# Patient Record
Sex: Male | Born: 1985
Health system: Southern US, Community
[De-identification: ages and names within clinical notes are randomized; demographics above are authoritative.]

## PROBLEM LIST (undated history)

## (undated) DIAGNOSIS — Q210 Ventricular septal defect: Secondary | ICD-10-CM

## (undated) HISTORY — DX: Ventricular septal defect: Q21.0

## (undated) HISTORY — PX: OTHER SURGICAL HISTORY: SHX169

---
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
## 2006-07-14 ENCOUNTER — Ambulatory Visit: Payer: Self-pay | Admitting: Internal Medicine

## 2006-07-14 LAB — CONVERTED CEMR LAB
ALT: 27 units/L (ref 0–40)
AST: 19 units/L (ref 0–37)
Albumin: 4.4 g/dL (ref 3.5–5.2)
Alkaline Phosphatase: 58 units/L (ref 39–117)
BUN: 11 mg/dL (ref 6–23)
Basophils Absolute: 0 10*3/uL (ref 0.0–0.1)
Basophils Relative: 0.4 % (ref 0.0–1.0)
Bilirubin, Direct: 0.1 mg/dL (ref 0.0–0.3)
CO2: 31 meq/L (ref 19–32)
Calcium: 9.3 mg/dL (ref 8.4–10.5)
Chloride: 108 meq/L (ref 96–112)
Cholesterol: 163 mg/dL (ref 0–200)
Creatinine, Ser: 0.9 mg/dL (ref 0.4–1.5)
Eosinophils Absolute: 0.4 10*3/uL (ref 0.0–0.6)
Eosinophils Relative: 6.3 % — ABNORMAL HIGH (ref 0.0–5.0)
GFR calc Af Amer: 138 mL/min
GFR calc non Af Amer: 114 mL/min
Glucose, Bld: 94 mg/dL (ref 70–99)
HCT: 41.6 % (ref 39.0–52.0)
HDL: 32 mg/dL — ABNORMAL LOW (ref >39.0)
Hemoglobin: 14.8 g/dL (ref 13.0–17.0)
LDL Cholesterol: 112 mg/dL — ABNORMAL HIGH (ref 0–99)
Lymphocytes Relative: 23.4 % (ref 12.0–46.0)
MCHC: 35.5 g/dL (ref 30.0–36.0)
MCV: 89.8 fL (ref 78.0–100.0)
Monocytes Absolute: 0.5 10*3/uL (ref 0.2–0.7)
Monocytes Relative: 8.5 % (ref 3.0–11.0)
Neutro Abs: 3.9 10*3/uL (ref 1.4–7.7)
Neutrophils Relative %: 61.4 % (ref 43.0–77.0)
Platelets: 221 10*3/uL (ref 150–400)
Potassium: 4.1 meq/L (ref 3.5–5.1)
RBC: 4.63 M/uL (ref 4.22–5.81)
RDW: 11.3 % — ABNORMAL LOW (ref 11.5–14.6)
Sodium: 143 meq/L (ref 135–145)
TSH: 5.23 microintl units/mL (ref 0.35–5.50)
Total Bilirubin: 0.9 mg/dL (ref 0.3–1.2)
Total CHOL/HDL Ratio: 5.1
Total Protein: 7.1 g/dL (ref 6.0–8.3)
Triglycerides: 94 mg/dL (ref 0–149)
VLDL: 19 mg/dL (ref 0–40)
WBC: 6.4 10*3/uL (ref 4.5–10.5)

## 2006-10-08 DIAGNOSIS — J309 Allergic rhinitis, unspecified: Secondary | ICD-10-CM | POA: Insufficient documentation

## 2006-10-08 DIAGNOSIS — J45909 Unspecified asthma, uncomplicated: Secondary | ICD-10-CM | POA: Insufficient documentation

## 2008-02-03 ENCOUNTER — Ambulatory Visit: Payer: Self-pay | Admitting: Family Medicine

## 2008-02-03 DIAGNOSIS — R42 Dizziness and giddiness: Secondary | ICD-10-CM | POA: Insufficient documentation

## 2008-09-06 ENCOUNTER — Encounter: Admission: RE | Admit: 2008-09-06 | Discharge: 2008-11-08 | Payer: Self-pay | Admitting: Unknown Physician Specialty

## 2009-03-07 ENCOUNTER — Telehealth: Payer: Self-pay | Admitting: Family Medicine

## 2010-03-29 NOTE — Progress Notes (Signed)
Summary: ? pink eye  Phone Note Call from Patient   Caller: Patient Summary of Call: thinks has conjunctivitis eye sticky red matter,  [facility].  thanks  Initial call taken by: Pura Spice, RN,  March 07, 2009 10:11 AM  Follow-up for Phone Call        I sent in a rx for Tobradex. Follow-up by: Nelwyn Salisbury MD,  March 07, 2009 10:28 AM    New/Updated Medications: TOBRADEX 0.3-0.1 % SUSP (TOBRAMYCIN-DEXAMETHASONE) apply 2 drops to eyes q 4 hours as needed Prescriptions: TOBRADEX 0.3-0.1 % SUSP (TOBRAMYCIN-DEXAMETHASONE) apply 2 drops to eyes q 4 hours as needed  #10 x 0   Entered and Authorized by:   Nelwyn Salisbury MD   Signed by:   Nelwyn Salisbury MD on 03/07/2009   Method used:   Electronically to        [facility] [phone]* (retail)       [address]       [place], [place]  [address]       Ph: [phone] or [phone]       Fax: [phone]   RxID:   [id number]

## 2010-07-11 LAB — HEPATIC FUNCTION PANEL
ALT: 14 U/L (ref 10–40)
AST: 14 U/L (ref 14–40)
Alkaline Phosphatase: 72 U/L (ref 25–125)
Bilirubin, Direct: 0.1 mg/dL
Bilirubin, Total: 0.6 mg/dL

## 2010-07-11 LAB — BASIC METABOLIC PANEL: BUN: 11 mg/dL (ref 4–21)

## 2010-07-30 ENCOUNTER — Ambulatory Visit: Payer: Self-pay | Admitting: Internal Medicine

## 2010-08-07 ENCOUNTER — Ambulatory Visit (INDEPENDENT_AMBULATORY_CARE_PROVIDER_SITE_OTHER): Payer: 59 | Admitting: Internal Medicine

## 2010-08-07 ENCOUNTER — Encounter: Payer: Self-pay | Admitting: Internal Medicine

## 2010-08-07 ENCOUNTER — Other Ambulatory Visit: Payer: Self-pay | Admitting: Internal Medicine

## 2010-08-07 VITALS — BP 116/74 | HR 88 | Ht 71.0 in | Wt 219.0 lb

## 2010-08-07 DIAGNOSIS — R1013 Epigastric pain: Secondary | ICD-10-CM

## 2010-08-07 DIAGNOSIS — R141 Gas pain: Secondary | ICD-10-CM

## 2010-08-07 DIAGNOSIS — R143 Flatulence: Secondary | ICD-10-CM

## 2010-08-07 DIAGNOSIS — K3189 Other diseases of stomach and duodenum: Secondary | ICD-10-CM

## 2010-08-07 DIAGNOSIS — R140 Abdominal distension (gaseous): Secondary | ICD-10-CM

## 2010-08-07 MED ORDER — ESOMEPRAZOLE MAGNESIUM 40 MG PO CPDR: 40.0000 mg | DELAYED_RELEASE_CAPSULE | Freq: Every day | ORAL | Status: DC

## 2010-08-07 NOTE — Patient Instructions (Addendum)
We have sent a prescription to your pharmacy for Nexium. You should take 1 tablet by mouth once daily. Dr Massie Kluver

## 2010-08-07 NOTE — Progress Notes (Signed)
Billy Mcguire 09/10/1985 MRN [id number]        History of Present Illness:  This is a 25 year old white male patient of Dr. Christell Constant has been experiencing episodes of bloating and belching which started while on a cruise through the [place] in February 2012. He had several episodes of vomiting and belching but no diarrhea or fever. After his return  Home, he has been experiencing episodes of dyspepsia, occasional heartburn and bloating. The bad belches have subsided after a course of Flagyl 250 mg 3 times a day for 10 days. He has been able to resume his usual eating habits. He takes occasional ibuprofen. He does not smoke and does not drink excessive caffeine. He has tried Gas-X without much relief. There is no family history of inflammatory bowel disease or gallbladder problems. His weight has been stable. His stools cultures and C. difficile toxin were negative. His CBC has been normal. He in general is feeling fine except for a nonspecific epigastric tenderness.   Past Medical History  Diagnosis Date  . VSD (ventricular septal defect)   . Asthma    Past Surgical History  Procedure Date  . Surgery for vsd     as an infant    reports that he has never smoked. He has never used smokeless tobacco. He reports that he does not drink alcohol or use illicit drugs. family history includes Colon polyps in his paternal grandmother; Diabetes in his paternal grandmother; Hypertension in his maternal grandfather; and Prostate cancer in his paternal uncle. No Known Allergies      Review of Systems: Denies dysphagia, odynophagia. Denies shortness of breath or chest pain. Negative for diarrhea  The remainder of the 10  point ROS is negative except as outlined in H&P   Physical Exam: General appearance  Well developed in no distress Eyes- non icteric HEENT nontraumatic, normocephalic Mouth no lesions, tongue papillated, no cheilosis Neck supple without adenopathy, thyroid not enlarged, no  carotid bruits, no JVD Lungs Clear to auscultation bilaterally Cor normal S1 normal S2, regular rhythm , no murmur,  quiet precordium Abdomen soft with normal active bowel sounds. No distention. No tympany. Mild nonspecific tenderness in epigastrium. No CVA tenderness. Lower abdomen unremarkable, liver edge at costal margin Rectal: Soft Hemoccult negative stool Extremities no pedal edema Skin no lesions Neurological alert and oriented x3 Psychological normal mood and  affect  Assessment and Plan:  Nonspecific dyspepsia and epigastric discomfort consistent with gastritis. His  H. pylori titer was negative. His symptoms started likely witht infectious gastroenteritis or possibly peptic gastritis. 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 He is doing much better in that respect. after a course of Flagyl. Nothing to suggest inflammatory bowel disease or biliary problems. 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 We will check tissue transglutaminase levels to rule out gluten allergy. We will also give a four-week trial off Nexium 40 mg a day. If not improved in 4 weeks we'll proceed with diagnostic upper endoscopy and biopsies. He agrees to the plan  Hx of VSD : status  post surgical repair as an infant. There are no issues at this time   08/07/2010 Billy Mcguire

## 2010-08-08 LAB — TISSUE TRANSGLUTAMINASE, IGA: Tissue Transglutaminase Ab, IgA: 6.1 U/mL (ref <20)

## 2010-08-12 NOTE — Progress Notes (Signed)
Awaiting results

## 2010-08-14 NOTE — Progress Notes (Signed)
Patient already advised of normal results. See 08/07/10 note scanned under "media."

## 2010-09-04 ENCOUNTER — Ambulatory Visit: Payer: Self-pay | Admitting: Internal Medicine

## 2011-03-12 LAB — CBC AND DIFFERENTIAL
HCT: 43 % (ref 41–53)
Hemoglobin: 14.1 g/dL (ref 13.5–17.5)
Platelets: 217 10*3/uL (ref 150–399)
WBC: 6.8 10^3/mL

## 2012-05-09 ENCOUNTER — Encounter: Payer: Self-pay | Admitting: *Deleted

## 2012-06-25 ENCOUNTER — Telehealth: Payer: Self-pay | Admitting: *Deleted

## 2012-06-25 MED ORDER — ONDANSETRON HCL 4 MG PO TABS: 4.0000 mg | ORAL_TABLET | Freq: Three times a day (TID) | ORAL | Status: DC | PRN

## 2012-06-25 NOTE — Telephone Encounter (Signed)
Pt called in with complaints of nausea,vomitting and diarrhea

## 2012-09-21 ENCOUNTER — Ambulatory Visit (INDEPENDENT_AMBULATORY_CARE_PROVIDER_SITE_OTHER): Payer: 59 | Admitting: Family Medicine

## 2012-09-21 ENCOUNTER — Encounter: Payer: Self-pay | Admitting: Family Medicine

## 2012-09-21 VITALS — BP 120/84 | HR 79 | Temp 98.4°F | Wt 217.8 lb

## 2012-09-21 DIAGNOSIS — J019 Acute sinusitis, unspecified: Secondary | ICD-10-CM

## 2012-09-21 DIAGNOSIS — J029 Acute pharyngitis, unspecified: Secondary | ICD-10-CM

## 2012-09-21 LAB — POCT RAPID STREP A (OFFICE): Rapid Strep A Screen: NEGATIVE

## 2012-09-21 MED ORDER — CEFDINIR 300 MG PO CAPS: 300.0000 mg | ORAL_CAPSULE | Freq: Two times a day (BID) | ORAL | Status: DC

## 2012-09-21 MED ORDER — FLUTICASONE PROPIONATE 50 MCG/ACT NA SUSP: 2.0000 | Freq: Every day | NASAL | Status: DC

## 2012-09-21 NOTE — Progress Notes (Signed)
Patient ID: Billy Mcguire, male   DOB: 1985-07-01, 27 y.o.   MRN: [id number] SUBJECTIVE: CC: Chief Complaint  Patient presents with  . Acute Visit    sore throat congestion  taking otc sudafed    HPI: As above. Ears feels full. Had a fever today. Pressure in the sinuses. Head very stuffed up,  Past Medical History  Diagnosis Date  . VSD (ventricular septal defect)   . Asthma    Past Surgical History  Procedure Laterality Date  . Surgery for vsd      as an infant   No current outpatient prescriptions on file prior to visit.   No current facility-administered medications on file prior to visit.   No Known Allergies Immunization History  Administered Date(s) Administered  . Influenza Split 12/19/2011   History   Social History  . Marital Status: Single    Spouse Name: N/A    Number of Children: 0  . Years of Education: N/A   Occupational History  . 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 Smoking status: Never Smoker   . Smokeless tobacco: Never Used  . Alcohol Use: No  . Drug Use: No  . Sexually Active: Not on file   Other Topics Concern  . Not on file   Social History Narrative  . No narrative on file      ROS: As above in the HPI. All other systems are stable or negative.  OBJECTIVE: APPEARANCE:  Patient in no acute distress.The patient appeared well nourished and normally developed. Acyanotic. Waist: VITAL SIGNS:BP 120/84  Pulse 79  Temp(Src) 98.4 F (36.9 C) (Oral)  Wt 217 lb 12.8 oz (98.793 kg)  BMI 30.39 kg/m2 Afebrile at present. stuffy  SKIN: warm and  Dry without overt rashes, tattoos and scars  HEAD and Neck: without JVD, Head and scalp: normal Eyes:No scleral icterus. Fundi normal, eye movements normal. Ears: Auricle normal, canal normal, Tympanic membranes on the right red with a small effusion, insufflation normal. Nose: congested with the maxillary and frontal sinuses mildly tender. Throat: normal Neck &  thyroid: normal  CHEST & LUNGS: Chest wall: normal Lungs: Clear  CVS: Reveals the PMI to be normally located. Regular rhythm, First and Second Heart sounds are normal,  absence of murmurs, rubs or gallops. Peripheral vasculature: Radial pulses: normal Dorsal pedis pulses: normal Posterior pulses: normal   ASSESSMENT: Sore throat - Plan: POCT rapid strep A  Sinusitis, acute - Plan: cefdinir (OMNICEF) 300 MG capsule, fluticasone (FLONASE) 50 MCG/ACT nasal spray  PLAN:  Orders Placed This Encounter  Procedures  . POCT rapid strep A   Results for orders placed in visit on 09/21/12  POCT RAPID STREP A (OFFICE)      Result Value Range   Rapid Strep A Screen Negative  Negative   Meds ordered this encounter  Medications  . pseudoephedrine (SUDAFED) 30 MG tablet    Sig: Take 30 mg by mouth every 6 (six) hours as needed for congestion.  . cefdinir (OMNICEF) 300 MG capsule    Sig: Take 1 capsule (300 mg total) by mouth 2 (two) times daily.    Dispense:  20 capsule    Refill:  0  . fluticasone (FLONASE) 50 MCG/ACT nasal spray    Sig: Place 2 sprays into the nose daily.    Dispense:  16 g    Refill:  3    Return if symptoms worsen or fail to improve.  Adren Dollins P. Modesto Charon,  M.D.

## 2012-10-06 ENCOUNTER — Ambulatory Visit (INDEPENDENT_AMBULATORY_CARE_PROVIDER_SITE_OTHER): Payer: 59 | Admitting: Family Medicine

## 2012-10-06 ENCOUNTER — Encounter: Payer: Self-pay | Admitting: Family Medicine

## 2012-10-06 VITALS — BP 108/70 | HR 101 | Temp 100.5°F | Wt 223.0 lb

## 2012-10-06 DIAGNOSIS — R52 Pain, unspecified: Secondary | ICD-10-CM | POA: Insufficient documentation

## 2012-10-06 DIAGNOSIS — R509 Fever, unspecified: Secondary | ICD-10-CM | POA: Insufficient documentation

## 2012-10-06 LAB — POCT INFLUENZA A/B
Influenza A, POC: NEGATIVE
Influenza B, POC: NEGATIVE

## 2012-10-06 LAB — POCT CBC
Granulocyte percent: 87.1 %G — AB (ref 37–80)
HCT, POC: 41.2 % — AB (ref 43.5–53.7)
Hemoglobin: 14 g/dL — AB (ref 14.1–18.1)
Lymph, poc: 1.3 (ref 0.6–3.4)
MCH, POC: 29.2 pg (ref 27–31.2)
MCHC: 34 g/dL (ref 31.8–35.4)
MCV: 85.7 fL (ref 80–97)
MPV: 8.1 fL (ref 0–99.8)
POC Granulocyte: 12.4 — AB (ref 2–6.9)
POC LYMPH PERCENT: 8.9 %L — AB (ref 10–50)
Platelet Count, POC: 203 10*3/uL (ref 142–424)
RBC: 4.8 M/uL (ref 4.69–6.13)
RDW, POC: 12.4 %
WBC: 14.2 10*3/uL — AB (ref 4.6–10.2)

## 2012-10-06 MED ORDER — DOXYCYCLINE HYCLATE 100 MG PO TABS: 100.0000 mg | ORAL_TABLET | Freq: Two times a day (BID) | ORAL | Status: DC

## 2012-10-06 NOTE — Progress Notes (Signed)
Patient ID: Billy Mcguire, male   DOB: 01/11/1986, 27 y.o.   MRN: [id number] SUBJECTIVE: CC: Chief Complaint  Patient presents with  . Acute Visit    fever cough chills achy    HPI: Fever today. Lymph glands feel full. achey all over. No urinary symptoms. Minimal nasal congestion.only an occasional cough. No tick bite.  Past Medical History  Diagnosis Date  . VSD (ventricular septal defect)   . Asthma    Past Surgical History  Procedure Laterality Date  . Surgery for vsd      as an infant   Current Outpatient Prescriptions on File Prior to Visit  Medication Sig Dispense Refill  . fluticasone (FLONASE) 50 MCG/ACT nasal spray Place 2 sprays into the nose daily.  16 g  3  . cefdinir (OMNICEF) 300 MG capsule Take 1 capsule (300 mg total) by mouth 2 (two) times daily.  20 capsule  0  . pseudoephedrine (SUDAFED) 30 MG tablet Take 30 mg by mouth every 6 (six) hours as needed for congestion.       No current facility-administered medications on file prior to visit.   No Known Allergies Immunization History  Administered Date(s) Administered  . Influenza Split 12/19/2011   History   Social History  . Marital Status: Single    Spouse Name: N/A    Number of Children: 0  . Years of Education: N/A   Occupational History  . 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 Smoking status: Never Smoker   . Smokeless tobacco: Never Used  . Alcohol Use: No  . Drug Use: No  . Sexually Active: Not on file   Other Topics Concern  . Not on file   Social History Narrative  . No narrative on file      ROS: As above in the HPI. All other systems are stable or negative.  OBJECTIVE: APPEARANCE:  Patient in no acute distress.The patient appeared well nourished and normally developed. Acyanotic. Waist: VITAL SIGNS:BP 108/70  Pulse 101  Temp(Src) 100.5 F (38.1 C) (Oral)  Wt 223 lb (101.152 kg)  BMI 31.12 kg/m2  SpO2 94% WM febrile SKIN: warm and  Dry without  overt rashes, tattoos and scars. Low grade fever post ibuprofen.  HEAD and Neck: without JVD, Head and scalp: normal Eyes:No scleral icterus. Fundi normal, eye movements normal. Ears: Auricle normal, canal normal, Tympanic membranes normal, insufflation normal. Nose: stuffiness Throat: normal Neck & thyroid: normal  CHEST & LUNGS: Chest wall: normal Lungs: Clear  CVS: Reveals the PMI to be normally located. Regular rhythm, First and Second Heart sounds are normal,  absence of murmurs, rubs or gallops. Peripheral vasculature: Radial pulses: normal  ABDOMEN:  Appearance: normal Benign, no organomegaly, no masses, no Abdominal Aortic enlargement. No Guarding , no rebound. No Bruits. Bowel sounds: normal  RECTAL: N/A GU: N/A  EXTREMETIES: nonedematous. Both Femoral and Pedal pulses are normal.  MUSCULOSKELETAL:  Spine: normal Joints: intact  NEUROLOGIC: oriented to time,place and person; nonfocal.  ASSESSMENT: Fever, unspecified - Plan: doxycycline (VIBRA-TABS) 100 MG tablet  Body aches - Plan: POCT CBC, POCT Influenza A/B  PLAN: Orders Placed This Encounter  Procedures  . POCT CBC  . POCT Influenza A/B   Results for orders placed in visit on 10/06/12  POCT CBC      Result Value Range   WBC 14.2 (*) 4.6 - 10.2 K/uL   Lymph, poc 1.3  0.6 - 3.4   POC LYMPH PERCENT  8.9 (*) 10 - 50 %L   POC Granulocyte 12.4 (*) 2 - 6.9   Granulocyte percent 87.1 (*) 37 - 80 %G   RBC 4.8  4.69 - 6.13 M/uL   Hemoglobin 14.0 (*) 14.1 - 18.1 g/dL   HCT, POC 11.9 (*) 14.7 - 53.7 %   MCV 85.7  80 - 97 fL   MCH, POC 29.2  27 - 31.2 pg   MCHC 34.0  31.8 - 35.4 g/dL   RDW, POC 82.9     Platelet Count, POC 203.0  142 - 424 K/uL   MPV 8.1  0 - 99.8 fL  POCT INFLUENZA A/B      Result Value Range   Influenza A, POC Negative     Influenza B, POC Negative     Meds ordered this encounter  Medications  . doxycycline (VIBRA-TABS) 100 MG tablet    Sig: Take 1 tablet (100 mg total) by  mouth 2 (two) times daily.    Dispense:  20 tablet    Refill:  0   Return if symptoms worsen or fail to improve.  Anelise Staron P. Modesto Charon, M.D.

## 2012-10-13 ENCOUNTER — Ambulatory Visit: Payer: 59 | Admitting: Family Medicine

## 2013-02-24 ENCOUNTER — Ambulatory Visit (INDEPENDENT_AMBULATORY_CARE_PROVIDER_SITE_OTHER): Payer: 59 | Admitting: Family Medicine

## 2013-02-24 ENCOUNTER — Ambulatory Visit (INDEPENDENT_AMBULATORY_CARE_PROVIDER_SITE_OTHER): Payer: 59

## 2013-02-24 ENCOUNTER — Encounter: Payer: Self-pay | Admitting: Family Medicine

## 2013-02-24 VITALS — BP 123/74 | HR 86 | Temp 99.2°F | Ht 71.0 in | Wt 226.2 lb

## 2013-02-24 DIAGNOSIS — J209 Acute bronchitis, unspecified: Secondary | ICD-10-CM | POA: Insufficient documentation

## 2013-02-24 DIAGNOSIS — R059 Cough, unspecified: Secondary | ICD-10-CM | POA: Insufficient documentation

## 2013-02-24 DIAGNOSIS — R05 Cough: Secondary | ICD-10-CM

## 2013-02-24 DIAGNOSIS — J45909 Unspecified asthma, uncomplicated: Secondary | ICD-10-CM

## 2013-02-24 MED ORDER — AZITHROMYCIN 250 MG PO TABS: ORAL_TABLET | ORAL | Status: DC

## 2013-02-24 MED ORDER — BUDESONIDE-FORMOTEROL FUMARATE 160-4.5 MCG/ACT IN AERO: 2.0000 | INHALATION_SPRAY | Freq: Two times a day (BID) | RESPIRATORY_TRACT | Status: AC

## 2013-02-25 NOTE — Progress Notes (Signed)
Patient ID: Billy Mcguire, male   DOB: 09/18/1985, 28 y.o.   MRN: [id number] SUBJECTIVE: CC: Chief Complaint  Patient presents with  . Cough    started 02/23/13  . Fever    HPI:  started yesterday and suddenly worse today with coughing and chest congestion. Has h/o asthma but no wheezes. Coughing up brown phlegm. Low grade temp. No chilles, no hemoptysis. Doesn't smoke. No chest pain. No SOB.  Past Medical History  Diagnosis Date  . VSD (ventricular septal defect)   . Asthma    Past Surgical History  Procedure Laterality Date  . Surgery for vsd      as an infant   History   Social History  . Marital Status: Single    Spouse Name: N/A    Number of Children: 0  . Years of Education: N/A   Occupational History  . 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 Smoking status: Never Smoker   . Smokeless tobacco: Never Used  . Alcohol Use: No  . Drug Use: No  . Sexual Activity: Not on file   Other Topics Concern  . Not on file   Social History Narrative  . No narrative on file   Family History  Problem Relation Age of Onset  . Diabetes Paternal Grandmother   . Hypertension Maternal Grandfather   . Prostate cancer Paternal Uncle   . Colon polyps Paternal Grandmother    No current outpatient prescriptions on file prior to visit.   No current facility-administered medications on file prior to visit.   No Known Allergies Immunization History  Administered Date(s) Administered  . Influenza Split 12/19/2011   Prior to Admission medications   Medication Sig Start Date End Date Taking? Authorizing Provider  azithromycin (ZITHROMAX Z-PAK) 250 MG tablet 2 tabs on day 1 and 1 tab on days 2 to 5 02/24/13   Ileana Ladd, MD  budesonide-formoterol [facility]) 160-4.5 MCG/ACT inhaler Inhale 2 puffs into the lungs 2 (two) times daily. 02/24/13   Ileana Ladd, MD     ROS: As above in the HPI. All other systems are stable or  negative.  OBJECTIVE: APPEARANCE:  Patient in no acute distress.The patient appeared well nourished and normally developed. Acyanotic. Waist: VITAL SIGNS:BP 123/74  Pulse 86  Temp(Src) 99.2 F (37.3 C) (Oral)  Ht 5\' 11"  (1.803 m)  Wt 226 lb 3.2 oz (102.604 kg)  BMI 31.56 kg/m2  SpO2 96%   SKIN: warm and  Dry without overt rashes, tattoos and scars  HEAD and Neck: without JVD, Head and scalp: normal Eyes:No scleral icterus. Fundi normal, eye movements normal. Ears: Auricle normal, canal normal, Tympanic membranes normal, insufflation normal. Nose: normal Throat: normal Neck & thyroid: normal  CHEST & LUNGS: Chest wall: normal Lungs: Coarse BS. With prolonged expiration phase. No Rales. No wheezes.   CVS: Reveals the PMI to be normally located. Regular rhythm, First and Second Heart sounds are normal,  absence of murmurs, rubs or gallops. Peripheral vasculature: Radial pulses: normal Dorsal pedis pulses: normal Posterior pulses: normal  ABDOMEN:  Appearance: normal Benign, no organomegaly, no masses, no Abdominal Aortic enlargement. No Guarding , no rebound. No Bruits. Bowel sounds: normal  RECTAL: N/A GU: N/A  EXTREMETIES: nonedematous.  MUSCULOSKELETAL:  Spine: normal Joints: intact  NEUROLOGIC: oriented to time,place and person; nonfocal. Strength is normal Sensory is normal Reflexes are normal Cranial Nerves are normal.  ASSESSMENT: Cough - Plan: DG Chest 2 View  ASTHMA -  Plan: budesonide-formoterol (SYMBICORT) 160-4.5 MCG/ACT inhaler  Acute bronchitis - Plan: azithromycin (ZITHROMAX Z-PAK) 250 MG tablet  PLAN:  Orders Placed This Encounter  Procedures  . DG Chest 2 View    Standing Status: Future     Number of Occurrences: 1     Standing Expiration Date: 04/26/2014    Order Specific Question:  Reason for Exam (SYMPTOM  OR DIAGNOSIS REQUIRED)    Answer:  cough    Order Specific Question:  Preferred imaging location?    Answer:  Internal   [facility] reading (PRIMARY) by  Dr. Modesto CharonWong: no infiltrates, however peribronchial congestion c/w bronchitis.                             Fluids rest. OTC cough medication. Meds ordered this encounter  Medications  . azithromycin (ZITHROMAX Z-PAK) 250 MG tablet    Sig: 2 tabs on day 1 and 1 tab on days 2 to 5    Dispense:  6 each    Refill:  0  . budesonide-formoterol (SYMBICORT) 160-4.5 MCG/ACT inhaler    Sig: Inhale 2 puffs into the lungs 2 (two) times daily.    Dispense:  1 Inhaler    Refill:  0   Medications Discontinued During This Encounter  Medication Reason  . cefdinir (OMNICEF) 300 MG capsule Completed Course  . doxycycline (VIBRA-TABS) 100 MG tablet Completed Course  . fluticasone (FLONASE) 50 MCG/ACT nasal spray Completed Course  . pseudoephedrine (SUDAFED) 30 MG tablet Completed Course   Return if symptoms worsen or fail to improve.  Adewale Pucillo P. Modesto CharonWong, M.D.

## 2013-03-01 ENCOUNTER — Encounter: Payer: Self-pay | Admitting: Family Medicine

## 2013-03-01 ENCOUNTER — Ambulatory Visit (INDEPENDENT_AMBULATORY_CARE_PROVIDER_SITE_OTHER): Payer: 59 | Admitting: Family Medicine

## 2013-03-01 VITALS — BP 126/86 | HR 95 | Temp 97.7°F | Ht 71.0 in | Wt 226.0 lb

## 2013-03-01 DIAGNOSIS — J209 Acute bronchitis, unspecified: Secondary | ICD-10-CM

## 2013-03-01 DIAGNOSIS — R059 Cough, unspecified: Secondary | ICD-10-CM

## 2013-03-01 DIAGNOSIS — J45909 Unspecified asthma, uncomplicated: Secondary | ICD-10-CM

## 2013-03-01 DIAGNOSIS — R05 Cough: Secondary | ICD-10-CM

## 2013-03-01 NOTE — Progress Notes (Signed)
Patient ID: Billy Mcguire, male   DOB: 03-25-1985, 28 y.o.   MRN: [id number] SUBJECTIVE: CC: Chief Complaint  Patient presents with  . Acute Visit    reck lungs coughing yellow -brown  on mucinex peak flow 600    HPI: Sen last week with acute bronchitis and upper  Respiratory infection with a spike of fever to 102 in 24 hours of his visit. Tamiflu was not readily available on [place] day. Fever broke but rattling cough chest congestion persisting with copious phlegm.as above. No fever now. Occasional wheeze. Peak flow was 600 Pulse Ox 99% No chest pain. Just completed the z pak   Past Medical History  Diagnosis Date  . VSD (ventricular septal defect)   . Asthma    Past Surgical History  Procedure Laterality Date  . Surgery for vsd      as an infant   History   Social History  . Marital Status: Single    Spouse Name: N/A    Number of Children: 0  . 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 Smoking status: Never Smoker   . Smokeless tobacco: Never Used  . Alcohol Use: No  . Drug Use: No  . Sexual Activity: Not on file   Other Topics Concern  . Not on file   Social History Narrative  . No narrative on file   Family History  Problem Relation Age of Onset  . Diabetes Paternal Grandmother   . Hypertension Maternal Grandfather   . Prostate cancer Paternal Uncle   . Colon polyps Paternal Grandmother    Current Outpatient Prescriptions on File Prior to Visit  Medication Sig Dispense Refill  . azithromycin (ZITHROMAX Z-PAK) 250 MG tablet 2 tabs on day 1 and 1 tab on days 2 to 5  6 each  0  . budesonide-formoterol (SYMBICORT) 160-4.5 MCG/ACT inhaler Inhale 2 puffs into the lungs 2 (two) times daily.  1 Inhaler  0   No current facility-administered medications on file prior to visit.   No Known Allergies Immunization History  Administered Date(s) Administered  . Influenza Split 12/19/2011    Prior to Admission medications   Medication Sig Start Date End Date Taking? Authorizing Provider  azithromycin (ZITHROMAX Z-PAK) 250 MG tablet 2 tabs on day 1 and 1 tab on days 2 to 5 02/24/13  Yes Ileana LaddFrancis P Maziah Keeling, MD  budesonide-formoterol [facility](SYMBICORT) 160-4.5 MCG/ACT inhaler Inhale 2 puffs into the lungs 2 (two) times daily. 02/24/13  Yes Ileana LaddFrancis P Alfreida Steffenhagen, MD     ROS: As above in the HPI. All other systems are stable or negative.  OBJECTIVE: APPEARANCE:  Patient in no acute distress.The patient appeared well nourished and normally developed. Acyanotic. Waist: VITAL SIGNS:BP 126/86  Pulse 95  Temp(Src) 97.7 F (36.5 C) (Oral)  Ht 5\' 11"  (1.803 m)  Wt 226 lb (102.513 kg)  BMI 31.53 kg/m2  SpO2 99%   SKIN: warm and  Dry without overt rashes, tattoos and scars  HEAD and Neck: without JVD, Head and scalp: normal Eyes:No scleral icterus. Fundi normal, eye movements normal. Ears: Auricle normal, canal normal, Tympanic membranes normal, insufflation normal. Nose: nasal congestion mild Throat: normal Neck & thyroid: normal  CHEST & LUNGS: Chest wall: normal Lungs: Coarse breath sounds with Rhonchi and scattered transmitted sounds and rattles that are reversible.no Rales. Good airflow. scattered wheezes especially there are end expiratory wheezes.  CVS: Reveals the PMI  to be normally located. Regular rhythm, First and Second Heart sounds are normal,  absence of murmurs, rubs or gallops. Peripheral vasculature: Radial pulses: normal Dorsal pedis pulses: normal Posterior pulses: normal  ABDOMEN:  Appearance: normal Benign, no organomegaly, no masses, no Abdominal Aortic enlargement. No Guarding , no rebound. No Bruits. Bowel sounds: normal  RECTAL: N/A GU: N/A  EXTREMETIES: nonedematous.  MUSCULOSKELETAL:  Spine: normal Joints: intact  NEUROLOGIC: oriented to time,place and person; nonfocal. Strength is normal Sensory is normal Reflexes are normal Cranial  Nerves are normal.  ASSESSMENT: ASTHMA  Acute bronchitis  Cough  PLAN: Use the symbicort 2 puffs bid. Called in a Rx for symbicort.  No orders of the defined types were placed in this encounter.   No orders of the defined types were placed in this encounter.   There are no discontinued medications. Return if symptoms worsen or fail to improve. Consider adding singulair   Ruari Mudgett P. Modesto Charon, M.D.

## 2013-06-15 ENCOUNTER — Other Ambulatory Visit: Payer: Self-pay | Admitting: Family Medicine

## 2013-06-15 ENCOUNTER — Ambulatory Visit (INDEPENDENT_AMBULATORY_CARE_PROVIDER_SITE_OTHER): Payer: 59 | Admitting: Family Medicine

## 2013-06-15 ENCOUNTER — Encounter: Payer: Self-pay | Admitting: Family Medicine

## 2013-06-15 VITALS — BP 116/75 | HR 89 | Temp 97.8°F | Ht 71.0 in | Wt 233.4 lb

## 2013-06-15 DIAGNOSIS — H109 Unspecified conjunctivitis: Secondary | ICD-10-CM | POA: Insufficient documentation

## 2013-06-15 DIAGNOSIS — J209 Acute bronchitis, unspecified: Secondary | ICD-10-CM

## 2013-06-15 DIAGNOSIS — J040 Acute laryngitis: Secondary | ICD-10-CM | POA: Insufficient documentation

## 2013-06-15 DIAGNOSIS — Q210 Ventricular septal defect: Secondary | ICD-10-CM | POA: Insufficient documentation

## 2013-06-15 MED ORDER — CIPROFLOXACIN HCL 0.3 % OP SOLN: 2.0000 [drp] | OPHTHALMIC | Status: AC

## 2013-06-15 MED ORDER — SULFACETAMIDE SODIUM 10 % OP SOLN: 2.0000 [drp] | OPHTHALMIC | Status: DC

## 2013-06-15 MED ORDER — AZITHROMYCIN 250 MG PO TABS: ORAL_TABLET | ORAL | Status: AC

## 2013-06-15 NOTE — Progress Notes (Signed)
Patient ID: Billy Mcguire, male   DOB: [birth year], 28 y.o.   MRN: [id number] SUBJECTIVE: CC: Chief Complaint  Patient presents with  . Conjunctivitis  . Nasal Congestion    HPI: 1 week stuffy and congested. And the right eye is sticky day2. First day had fever 102 degrees took tylenol and fever broke. No wheezing, no SOB. Phlegm: today.   Past Medical History  Diagnosis Date  . VSD (ventricular septal defect)   . Asthma    Past Surgical History  Procedure Laterality Date  . Surgery for vsd      as an infant   History   Social History  . Marital Status: Single    Spouse Name: N/A    Number of Children: 0  . Years of Education: N/A   Occupational History  . radiology tech [employer]   Social History Main Topics  . Smoking status: Never Smoker   . Smokeless tobacco: Never Used  . Alcohol Use: No  . Drug Use: No  . Sexual Activity: Not on file   Other Topics Concern  . Not on file   Social History Narrative  . No narrative on file   Family History  Problem Relation Age of Onset  . Diabetes Paternal Grandmother   . Hypertension Maternal Grandfather   . Prostate cancer Paternal Uncle   . Colon polyps Paternal Grandmother    Current Outpatient Prescriptions on File Prior to Visit  Medication Sig Dispense Refill  . budesonide-formoterol (SYMBICORT) 160-4.5 MCG/ACT inhaler Inhale 2 puffs into the lungs 2 (two) times daily.  1 Inhaler  0   No current facility-administered medications on file prior to visit.   No Known Allergies Immunization History  Administered Date(s) Administered  . Influenza Split 12/19/2011   Prior to Admission medications   Medication Sig Start Date End Date Taking? Authorizing Provider  azithromycin (ZITHROMAX Z-PAK) 250 MG tablet 2 tabs on day 1 and 1 tab on days 2 to 5 02/24/13   Ileana LaddFrancis P Vickye Astorino, MD  budesonide-formoterol [facility](SYMBICORT) 160-4.5 MCG/ACT inhaler Inhale 2 puffs into the lungs 2 (two) times daily. 02/24/13   Ileana LaddFrancis P Ludwig Tugwell, MD      ROS: As above in the HPI. All other systems are stable or negative.  OBJECTIVE: APPEARANCE:  Patient in no acute distress.The patient appeared well nourished and normally developed. Acyanotic. Waist: VITAL SIGNS:BP 116/75  Pulse 89  Temp(Src) 97.8 F (36.6 C) (Oral)  Ht 5\' 11"  (1.803 m)  Wt 233 lb 6.4 oz (105.87 kg)  BMI 32.57 kg/m2  SpO2 98%   SKIN: warm and  Dry without overt rashes, tattoos and scars  HEAD and Neck: without JVD, Head and scalp: normal Eyes:No scleral icterus. Fundi normal, eye movements normal. Right eye injected and lacrimating. Ears: Auricle normal, canal normal, Tympanic membranes normal, insufflation normal. Nose: nasal congestion Throat: normal Neck & thyroid: normal except wheezing across the trachea  CHEST & LUNGS: Chest wall: normal Lungs: Clear though breath sounds coarse. No wheezes, no Rales.   CVS: Reveals the PMI to be normally located. Regular rhythm, First and Second Heart sounds are normal,  absence of murmurs, rubs or gallops. Peripheral vasculature: Radial pulses: normal Dorsal pedis pulses: normal Posterior pulses: normal  ABDOMEN:  Appearance: normal Benign, no organomegaly, no masses, no Abdominal Aortic enlargement. No Guarding , no rebound. No Bruits. Bowel sounds: normal  RECTAL: N/A GU: N/A  EXTREMETIES: nonedematous.  MUSCULOSKELETAL:  Spine: normal Joints: intact  NEUROLOGIC: oriented to time,place and  person; nonfocal. Strength is normal Sensory is normal Reflexes are normal Cranial Nerves are normal.  ASSESSMENT:  Acute bronchitis - Plan: azithromycin (ZITHROMAX Z-PAK) 250 MG tablet  Conjunctivitis unspecified - Plan: sulfacetamide (BLEPH-10) 10 % ophthalmic solution  Acute laryngitis  PLAN:  No orders of the defined types were placed in this encounter.   Meds ordered this encounter  Medications  . azithromycin (ZITHROMAX Z-PAK) 250 MG tablet    Sig: 2 tabs on day 1 and 1 tab on  days 2 to 5    Dispense:  6 each    Refill:  0  . sulfacetamide (BLEPH-10) 10 % ophthalmic solution    Sig: Place 2 drops into the right eye every 4 (four) hours.    Dispense:  15 mL    Refill:  0  hand hygiene.   Medications Discontinued During This Encounter  Medication Reason  . azithromycin (ZITHROMAX Z-PAK) 250 MG tablet Reorder   Return if symptoms worsen or fail to improve.  Billy Mcguire, M.D.

## 2013-08-20 ENCOUNTER — Encounter: Payer: Self-pay | Admitting: Dietician

## 2013-08-20 ENCOUNTER — Encounter: Payer: 59 | Attending: Family Medicine | Admitting: Dietician

## 2013-08-20 VITALS — Ht 70.0 in | Wt 233.0 lb

## 2013-08-20 DIAGNOSIS — Z713 Dietary counseling and surveillance: Secondary | ICD-10-CM | POA: Insufficient documentation

## 2013-08-20 DIAGNOSIS — R634 Abnormal weight loss: Secondary | ICD-10-CM | POA: Insufficient documentation

## 2013-08-20 DIAGNOSIS — E669 Obesity, unspecified: Secondary | ICD-10-CM | POA: Insufficient documentation

## 2013-08-20 NOTE — Progress Notes (Signed)
  Medical Nutrition Therapy:  Appt start time: 0930 end time:  1030.   Assessment:  Primary concerns today: Billy Mcguire is here today to try and eat better because he states he does not want to get diabetes with his family history.  He has tried vegetarian, and low-carb diets for weight loss and has always regained his weight. Last "diet" was a few months ago and he started at 225 lbs, he lost down to 217 lbs in 2 weeks, but regained the weight in a couple of weeks.  Billy Mcguire lives with his wife and child, his wife does most of the shopping and cooking. They have another child on the way (due in 8 weeks) and are also building a new house 30 min from where they live now.  They eat their meals in the kitchen with no distractions.   Billy Mcguire works second shift from 3 pm - 11 pm and goes to bed right when he gets home, normally after a snack.  They occasionally eat out once every two weeks to get [place] food.  Billy Mcguire states he would like to get down to 200 lbs in 6 months. We set a short term goal of 220 lbs in 3 months.  Billy Mcguire rates his eating habits as a 3 on a scale of 1-5 with 1 being the best.  He says he eats larger portion sizes and not enough fruit.    Preferred Learning Style  No preference indicated   Learning Readiness:  Ready  MEDICATIONS: see list   DIETARY INTAKE:  24-hr recall:  B ( AM): rice krispies with whole milk or scrambled eggs and toast 12:30 L ( PM): pork roast and sweet potatoes and green beans or pinto beans and cornbread 6: 00 D ( PM): meat, veggie, and startch 11:30 Snack (PM): pretzels Beverages: water, unsweet tea  Usual physical activity: none but does mow grass or gardening for about an 1 hr every day, in the past he liked to run on the treadmill or elyptical  Estimated energy needs: 2000-2200 calories  Progress Towards Goal(s):  In progress.   Nutritional Diagnosis:  [place]-3.3 Overweight/obesity As related to hectic schedule, yo-yo dieting, and large portions.  As evidenced  by patient report, dietary recall and BMI of 33.5..    Intervention:  Nutrition counseling on healthy weight loss.  Goal setting for exercise and eating habits.  - Eat 3 meals a day with 1-2 snacks as needed to keep your metabolism going. - Try to curb appetite in the evenings by eating every 3-4 hours throughout the day at work.  Carry healthy snacks with you. - Exercise 30 min - 1 hour for 5 days each week running or walking.  Stay hydrated! - Choose healthy fats when cooking and try to bake, broil, and grill. Choose 1% or 2% milk over whole milk. -  Be mindful of your hunger and fullness and wait 10 minutes before getting second helpings   Teaching Method Utilized all of the following: Visual Auditory Hands on  Handouts given during visit include:  Nutritional Strategies for Weight Loss   Barriers to learning/adherence to lifestyle change: none  Demonstrated degree of understanding via:  Teach Back   Monitoring/Evaluation:  Dietary intake, exercise, portion control, and body weight in 3 month(s)

## 2013-08-20 NOTE — Patient Instructions (Signed)
-   Eat 3 meals a day with 1-2 snacks as needed to keep your metabolism going. - Try to curb appetite in the evenings by eating every 3-4 hours throughout the day at work.  Carry healthy snacks with you. - Exercise 30 min - 1 hour for 5 days each week running or walking.  Stay hydrated! - Choose healthy fats when cooking and try to bake, broil, and grill. Choose 1% or 2% milk over whole milk. -  Be mindful of your hunger and fullness and wait 10 minutes before getting second helpings

## 2013-11-22 ENCOUNTER — Ambulatory Visit: Payer: 59 | Admitting: Dietician

## 2013-12-21 ENCOUNTER — Other Ambulatory Visit (HOSPITAL_COMMUNITY): Payer: Self-pay | Admitting: Family Medicine

## 2013-12-21 DIAGNOSIS — R1031 Right lower quadrant pain: Secondary | ICD-10-CM

## 2013-12-23 ENCOUNTER — Ambulatory Visit (HOSPITAL_COMMUNITY): Payer: 59

## 2013-12-24 ENCOUNTER — Ambulatory Visit (HOSPITAL_COMMUNITY)
Admission: RE | Admit: 2013-12-24 | Discharge: 2013-12-24 | Disposition: A | Discharge status: Home / Self Care | Payer: 59 | Source: Ambulatory Visit | Attending: Diagnostic Radiology | Admitting: Diagnostic Radiology

## 2013-12-24 DIAGNOSIS — R1031 Right lower quadrant pain: Secondary | ICD-10-CM | POA: Insufficient documentation

## 2014-11-05 ENCOUNTER — Emergency Department (HOSPITAL_BASED_OUTPATIENT_CLINIC_OR_DEPARTMENT_OTHER)
Admission: EM | Admit: 2014-11-05 | Discharge: 2014-11-05 | Disposition: A | Discharge status: Home / Self Care | Payer: 59 | Attending: Emergency Medicine | Admitting: Emergency Medicine

## 2014-11-05 ENCOUNTER — Encounter (HOSPITAL_BASED_OUTPATIENT_CLINIC_OR_DEPARTMENT_OTHER): Payer: Self-pay | Admitting: Emergency Medicine

## 2014-11-05 DIAGNOSIS — J45909 Unspecified asthma, uncomplicated: Secondary | ICD-10-CM | POA: Diagnosis not present

## 2014-11-05 DIAGNOSIS — R197 Diarrhea, unspecified: Secondary | ICD-10-CM | POA: Diagnosis not present

## 2014-11-05 DIAGNOSIS — R112 Nausea with vomiting, unspecified: Secondary | ICD-10-CM | POA: Diagnosis present

## 2014-11-05 DIAGNOSIS — Z7951 Long term (current) use of inhaled steroids: Secondary | ICD-10-CM | POA: Insufficient documentation

## 2014-11-05 DIAGNOSIS — Q210 Ventricular septal defect: Secondary | ICD-10-CM | POA: Diagnosis not present

## 2014-11-05 LAB — HEPATIC FUNCTION PANEL
ALK PHOS: 76 U/L (ref 38–126)
ALT: 25 U/L (ref 17–63)
AST: 25 U/L (ref 15–41)
Albumin: 4.8 g/dL (ref 3.5–5.0)
BILIRUBIN INDIRECT: 1.3 mg/dL — AB (ref 0.3–0.9)
BILIRUBIN TOTAL: 1.6 mg/dL — AB (ref 0.3–1.2)
Bilirubin, Direct: 0.3 mg/dL (ref 0.1–0.5)
TOTAL PROTEIN: 8.2 g/dL — AB (ref 6.5–8.1)

## 2014-11-05 LAB — CBC WITH DIFFERENTIAL/PLATELET
Basophils Absolute: 0 10*3/uL (ref 0.0–0.1)
Basophils Relative: 0 % (ref 0–1)
Eosinophils Absolute: 0 10*3/uL (ref 0.0–0.7)
Eosinophils Relative: 0 % (ref 0–5)
HEMATOCRIT: 42.3 % (ref 39.0–52.0)
HEMOGLOBIN: 14.8 g/dL (ref 13.0–17.0)
LYMPHS ABS: 0.4 10*3/uL — AB (ref 0.7–4.0)
LYMPHS PCT: 3 % — AB (ref 12–46)
MCH: 30.4 pg (ref 26.0–34.0)
MCHC: 35 g/dL (ref 30.0–36.0)
MCV: 86.9 fL (ref 78.0–100.0)
MONOS PCT: 7 % (ref 3–12)
Monocytes Absolute: 1 10*3/uL (ref 0.1–1.0)
NEUTROS ABS: 12.7 10*3/uL — AB (ref 1.7–7.7)
NEUTROS PCT: 90 % — AB (ref 43–77)
Platelets: 245 10*3/uL (ref 150–400)
RBC: 4.87 MIL/uL (ref 4.22–5.81)
RDW: 12.8 % (ref 11.5–15.5)
WBC: 14.2 10*3/uL — AB (ref 4.0–10.5)

## 2014-11-05 LAB — COMPREHENSIVE METABOLIC PANEL
ALBUMIN: 4.8 g/dL (ref 3.5–5.0)
ALT: 21 U/L (ref 17–63)
ANION GAP: 12 (ref 5–15)
AST: 26 U/L (ref 15–41)
Alkaline Phosphatase: 75 U/L (ref 38–126)
BUN: 18 mg/dL (ref 6–20)
CHLORIDE: 105 mmol/L (ref 101–111)
CO2: 22 mmol/L (ref 22–32)
Calcium: 9.8 mg/dL (ref 8.9–10.3)
Creatinine, Ser: 0.82 mg/dL (ref 0.61–1.24)
GFR calc non Af Amer: >60 mL/min (ref >60)
GLUCOSE: 127 mg/dL — AB (ref 65–99)
Potassium: 3.7 mmol/L (ref 3.5–5.1)
SODIUM: 139 mmol/L (ref 135–145)
Total Bilirubin: 1.7 mg/dL — ABNORMAL HIGH (ref 0.3–1.2)
Total Protein: 8.2 g/dL — ABNORMAL HIGH (ref 6.5–8.1)

## 2014-11-05 LAB — LIPASE, BLOOD: Lipase: 16 U/L — ABNORMAL LOW (ref 22–51)

## 2014-11-05 LAB — AMYLASE: AMYLASE: 23 U/L — AB (ref 28–100)

## 2014-11-05 MED ORDER — DICYCLOMINE HCL 20 MG PO TABS: 20.0000 mg | ORAL_TABLET | Freq: Two times a day (BID) | ORAL | Status: AC

## 2014-11-05 MED ORDER — SODIUM CHLORIDE 0.9 % IV BOLUS (SEPSIS)
1000.0000 mL | Freq: Once | INTRAVENOUS | Status: AC
  Administered 2014-11-05: 1000 mL via INTRAVENOUS

## 2014-11-05 MED ORDER — ONDANSETRON HCL 4 MG/2ML IJ SOLN
4.0000 mg | Freq: Once | INTRAMUSCULAR | Status: AC
  Administered 2014-11-05: 4 mg via INTRAVENOUS
  Filled 2014-11-05: qty 2

## 2014-11-05 MED ORDER — SODIUM CHLORIDE 0.9 % IV SOLN
1000.0000 mL | Freq: Once | INTRAVENOUS | Status: AC
  Administered 2014-11-05: 1000 mL via INTRAVENOUS

## 2014-11-05 MED ORDER — SUCRALFATE 1 GM/10ML PO SUSP: 1.0000 g | Freq: Three times a day (TID) | ORAL | Status: AC

## 2014-11-05 MED ORDER — ONDANSETRON 8 MG PO TBDP: ORAL_TABLET | ORAL | Status: AC

## 2014-11-05 MED ORDER — KETOROLAC TROMETHAMINE 30 MG/ML IJ SOLN
30.0000 mg | Freq: Once | INTRAMUSCULAR | Status: AC
Start: 2014-11-05 — End: 2014-11-05
  Administered 2014-11-05: 30 mg via INTRAVENOUS
  Filled 2014-11-05: qty 1

## 2014-11-05 MED ORDER — GI COCKTAIL ~~LOC~~
30.0000 mL | Freq: Once | ORAL | Status: AC
  Administered 2014-11-05: 30 mL via ORAL
  Filled 2014-11-05: qty 30

## 2014-11-05 MED ORDER — DICYCLOMINE HCL 10 MG/ML IM SOLN
20.0000 mg | Freq: Once | INTRAMUSCULAR | Status: AC
  Administered 2014-11-05: 20 mg via INTRAMUSCULAR
  Filled 2014-11-05: qty 2

## 2014-11-05 NOTE — ED Notes (Addendum)
Pt reports right upper epigastric pain with N/V at noon on 9/9. Pt states unable to keep anything down and no relief from vomiting after taking zofran.

## 2014-11-05 NOTE — ED Provider Notes (Signed)
CSN: [id number]     Arrival date & time 11/05/14  4098 History   First MD Initiated Contact with Patient 11/05/14 0524     Chief Complaint  Patient presents with  . Nausea  . Emesis     (Consider location/radiation/quality/duration/timing/severity/associated sxs/prior Treatment) Patient is a 29 y.o. male presenting with vomiting. The history is provided by the patient.  Emesis Severity:  Moderate Duration:  1 day Timing:  Intermittent Quality:  Stomach contents Progression:  Unchanged Chronicity:  New Recent urination:  Normal Relieved by:  Nothing Worsened by:  Nothing tried Ineffective treatments:  None tried Associated symptoms: abdominal pain and diarrhea   Risk factors: sick contacts     Past Medical History  Diagnosis Date  . VSD (ventricular septal defect)   . Asthma    Past Surgical History  Procedure Laterality Date  . Surgery for vsd      as an infant   Family History  Problem Relation Age of Onset  . Diabetes Paternal Grandmother   . Hypertension Maternal Grandfather   . Prostate cancer Paternal Uncle   . Colon polyps Paternal Grandmother    Social History  Substance Use Topics  . Smoking status: Never Smoker   . Smokeless tobacco: Never Used  . Alcohol Use: No    Review of Systems  Constitutional: Negative for fever.  Gastrointestinal: Positive for vomiting, abdominal pain and diarrhea.  All other systems reviewed and are negative.     Allergies  Sulfur  Home Medications   Prior to Admission medications   Medication Sig Start Date End Date Taking? Authorizing Provider  azithromycin (ZITHROMAX Z-PAK) 250 MG tablet 2 tabs on day 1 and 1 tab on days 2 to 5 06/15/13   Ileana Ladd, MD  budesonide-formoterol [facility]) 160-4.5 MCG/ACT inhaler Inhale 2 puffs into the lungs 2 (two) times daily. 02/24/13   Ileana Ladd, MD  ciprofloxacin (CILOXAN) 0.3 % ophthalmic solution Place 2 drops into the right eye every 4 (four) hours while awake.  06/15/13   Ileana Ladd, MD   BP 115/61 mmHg  Pulse 94  Temp(Src) 98.1 F (36.7 C) (Oral)  Resp 18  Ht 5\' 10"  (1.778 m)  Wt 230 lb (104.327 kg)  BMI 33.00 kg/m2  SpO2 100% Physical Exam  Constitutional: He is oriented to person, place, and time. He appears well-developed and well-nourished. No distress.  HENT:  Head: Normocephalic and atraumatic.  Mouth/Throat: Oropharynx is clear and moist.  Eyes: Conjunctivae and EOM are normal. Pupils are equal, round, and reactive to light.  Neck: Normal range of motion. Neck supple.  Cardiovascular: Normal rate, regular rhythm and intact distal pulses.   Pulmonary/Chest: Effort normal and breath sounds normal. No respiratory distress. He has no wheezes. He has no rales.  Abdominal: Soft. Bowel sounds are increased. There is no tenderness. There is no rebound and no guarding.  Musculoskeletal: Normal range of motion.  Neurological: He is alert and oriented to person, place, and time.  Skin: Skin is warm and dry.  Psychiatric: He has a normal mood and affect.    ED Course  Procedures (including critical care time) Labs Review Labs Reviewed  AMYLASE - Abnormal; Notable for the following:    Amylase 23 (*)    All other components within normal limits  COMPREHENSIVE METABOLIC PANEL - Abnormal; Notable for the following:    Glucose, Bld 127 (*)    Total Protein 8.2 (*)    Total Bilirubin 1.7 (*)  All other components within normal limits  LIPASE, BLOOD - Abnormal; Notable for the following:    Lipase 16 (*)    All other components within normal limits  CBC WITH DIFFERENTIAL/PLATELET - Abnormal; Notable for the following:    WBC 14.2 (*)    Neutrophils Relative % 90 (*)    Neutro Abs 12.7 (*)    Lymphocytes Relative 3 (*)    Lymphs Abs 0.4 (*)    All other components within normal limits  URINALYSIS, ROUTINE W REFLEX MICROSCOPIC (NOT AT [facility])    Imaging Review No results found. I have personally reviewed and evaluated these  images and lab results as part of my medical decision-making.   EKG Interpretation None      MDM   Final diagnoses:  None   Results for orders placed or performed during the hospital encounter of 11/05/14  Amylase  Result Value Ref Range   Amylase 23 (L) 28 - 100 U/L  Comprehensive metabolic panel  Result Value Ref Range   Sodium 139 135 - 145 mmol/L   Potassium 3.7 3.5 - 5.1 mmol/L   Chloride 105 101 - 111 mmol/L   CO2 22 22 - 32 mmol/L   Glucose, Bld 127 (H) 65 - 99 mg/dL   BUN 18 6 - 20 mg/dL   Creatinine, Ser 1.32 0.61 - 1.24 mg/dL   Calcium 9.8 8.9 - 44.0 mg/dL   Total Protein 8.2 (H) 6.5 - 8.1 g/dL   Albumin 4.8 3.5 - 5.0 g/dL   AST 26 15 - 41 U/L   ALT 21 17 - 63 U/L   Alkaline Phosphatase 75 38 - 126 U/L   Total Bilirubin 1.7 (H) 0.3 - 1.2 mg/dL   GFR calc non Af Amer >60 >60 mL/min   GFR calc Af Amer >60 >60 mL/min   Anion gap 12 5 - 15  Lipase, blood  Result Value Ref Range   Lipase 16 (L) 22 - 51 U/L  CBC WITH DIFFERENTIAL  Result Value Ref Range   WBC 14.2 (H) 4.0 - 10.5 K/uL   RBC 4.87 4.22 - 5.81 MIL/uL   Hemoglobin 14.8 13.0 - 17.0 g/dL   HCT 10.2 72.5 - 36.6 %   MCV 86.9 78.0 - 100.0 fL   MCH 30.4 26.0 - 34.0 pg   MCHC 35.0 30.0 - 36.0 g/dL   RDW 44.0 34.7 - 42.5 %   Platelets 245 150 - 400 K/uL   Neutrophils Relative % 90 (H) 43 - 77 %   Neutro Abs 12.7 (H) 1.7 - 7.7 K/uL   Lymphocytes Relative 3 (L) 12 - 46 %   Lymphs Abs 0.4 (L) 0.7 - 4.0 K/uL   Monocytes Relative 7 3 - 12 %   Monocytes Absolute 1.0 0.1 - 1.0 K/uL   Eosinophils Relative 0 0 - 5 %   Eosinophils Absolute 0.0 0.0 - 0.7 K/uL   Basophils Relative 0 0 - 1 %   Basophils Absolute 0.0 0.0 - 0.1 K/uL  Hepatic function panel  Result Value Ref Range   Total Protein 8.2 (H) 6.5 - 8.1 g/dL   Albumin 4.8 3.5 - 5.0 g/dL   AST 25 15 - 41 U/L   ALT 25 17 - 63 U/L   Alkaline Phosphatase 76 38 - 126 U/L   Total Bilirubin 1.6 (H) 0.3 - 1.2 mg/dL   Bilirubin, Direct 0.3 0.1 - 0.5 mg/dL    Indirect Bilirubin 1.3 (H) 0.3 - 0.9 mg/dL  No results found.   Medications  ondansetron (ZOFRAN) injection 4 mg (4 mg Intravenous Given 11/05/14 0518)  0.9 %  sodium chloride infusion (0 mLs Intravenous Stopped 11/05/14 0703)  ketorolac (TORADOL) 30 MG/ML injection 30 mg (30 mg Intravenous Given 11/05/14 0639)  dicyclomine (BENTYL) injection 20 mg (20 mg Intramuscular Given 11/05/14 0604)  sodium chloride 0.9 % bolus 1,000 mL (0 mLs Intravenous Stopped 11/05/14 0704)  gi cocktail (Maalox,Lidocaine,Donnatal) (30 mLs Oral Given 11/05/14 0604)  sodium chloride 0.9 % bolus 1,000 mL (1,000 mLs Intravenous New Bag/Given 11/05/14 0716)     Medication List    TAKE these medications        dicyclomine 20 MG tablet  Commonly known as:  BENTYL  Take 1 tablet (20 mg total) by mouth 2 (two) times daily.     ondansetron 8 MG disintegrating tablet  Commonly known as:  ZOFRAN ODT   ODT q8 hours prn nausea     sucralfate 1 GM/10ML suspension  Commonly known as:  CARAFATE  Take 10 mLs (1 g total) by mouth 4 (four) times daily -  with meals and at bedtime.      ASK your doctor about these medications        azithromycin 250 MG tablet  Commonly known as:  ZITHROMAX Z-PAK  2 tabs on day 1 and 1 tab on days 2 to 5     budesonide-formoterol 160-4.5 MCG/ACT inhaler  Commonly known as:  SYMBICORT  Inhale 2 puffs into the lungs 2 (two) times daily.     ciprofloxacin 0.3 % ophthalmic solution  Commonly known as:  CILOXAN  Place 2 drops into the right eye every 4 (four) hours while awake.        Feeling better post medication.  Now daughter at home has both diarrhea and vomiting.  I feel confident this is a viral GI illness.  There is no indication for imaging at this time.  Strict return precautions given and patient knows I will be back tonight if he has any concerns.      Cy Blamer, MD 11/05/14 [phone]

## 2015-02-21 ENCOUNTER — Other Ambulatory Visit (HOSPITAL_BASED_OUTPATIENT_CLINIC_OR_DEPARTMENT_OTHER): Payer: Self-pay | Admitting: Family Medicine

## 2015-02-21 ENCOUNTER — Ambulatory Visit (HOSPITAL_BASED_OUTPATIENT_CLINIC_OR_DEPARTMENT_OTHER)
Admission: RE | Admit: 2015-02-21 | Discharge: 2015-02-21 | Disposition: A | Discharge status: Home / Self Care | Payer: 59 | Source: Ambulatory Visit | Attending: Family Medicine | Admitting: Family Medicine

## 2015-02-21 DIAGNOSIS — M25562 Pain in left knee: Secondary | ICD-10-CM | POA: Diagnosis present

## 2015-03-20 DIAGNOSIS — S6010XA Contusion of unspecified finger with damage to nail, initial encounter: Secondary | ICD-10-CM | POA: Diagnosis not present

## 2015-03-20 DIAGNOSIS — S6991XA Unspecified injury of right wrist, hand and finger(s), initial encounter: Secondary | ICD-10-CM | POA: Diagnosis not present

## 2015-05-16 DIAGNOSIS — L603 Nail dystrophy: Secondary | ICD-10-CM | POA: Diagnosis not present

## 2015-06-27 DIAGNOSIS — Z0100 Encounter for examination of eyes and vision without abnormal findings: Secondary | ICD-10-CM | POA: Diagnosis not present

## 2015-07-24 DIAGNOSIS — S0003XA Contusion of scalp, initial encounter: Secondary | ICD-10-CM | POA: Diagnosis not present

## 2015-08-12 DIAGNOSIS — S61331A Puncture wound without foreign body of left index finger with damage to nail, initial encounter: Secondary | ICD-10-CM | POA: Diagnosis not present

## 2015-11-17 MED FILL — FLUTICASONE PROP 50 MCG SPR: 50 | 60 days supply | Qty: 32 | Fill #0

## 2015-11-27 DIAGNOSIS — A084 Viral intestinal infection, unspecified: Secondary | ICD-10-CM | POA: Diagnosis not present

## 2015-11-27 MED FILL — ONDANSETRON HCL 4 MG TABLET: 4 | 4 days supply | Qty: 10 | Fill #0

## 2016-02-22 MED FILL — FLUTICASONE PROP 50 MCG SPR: 50 | 60 days supply | Qty: 32 | Fill #1

## 2016-02-22 MED FILL — PROAIR HFA 90 MCG INHALER: 108 (90 BAS | 17 days supply | Qty: 9 | Fill #0

## 2016-07-02 DIAGNOSIS — Z0100 Encounter for examination of eyes and vision without abnormal findings: Secondary | ICD-10-CM | POA: Diagnosis not present

## 2016-07-17 DIAGNOSIS — Z13220 Encounter for screening for lipoid disorders: Secondary | ICD-10-CM | POA: Diagnosis not present

## 2016-07-17 DIAGNOSIS — Z0000 Encounter for general adult medical examination without abnormal findings: Secondary | ICD-10-CM | POA: Diagnosis not present

## 2016-07-17 DIAGNOSIS — E559 Vitamin D deficiency, unspecified: Secondary | ICD-10-CM | POA: Diagnosis not present

## 2016-07-17 MED FILL — FLUTICASONE PROP 50 MCG SPR: 50 | 60 days supply | Qty: 32 | Fill #0

## 2016-07-19 DIAGNOSIS — J45909 Unspecified asthma, uncomplicated: Secondary | ICD-10-CM | POA: Diagnosis not present

## 2016-07-19 DIAGNOSIS — J301 Allergic rhinitis due to pollen: Secondary | ICD-10-CM | POA: Diagnosis not present

## 2016-07-19 DIAGNOSIS — E663 Overweight: Secondary | ICD-10-CM | POA: Diagnosis not present

## 2016-07-19 DIAGNOSIS — Z0000 Encounter for general adult medical examination without abnormal findings: Secondary | ICD-10-CM | POA: Diagnosis not present

## 2016-07-19 DIAGNOSIS — Z889 Allergy status to unspecified drugs, medicaments and biological substances status: Secondary | ICD-10-CM | POA: Diagnosis not present

## 2016-07-19 DIAGNOSIS — Z6830 Body mass index (BMI) 30.0-30.9, adult: Secondary | ICD-10-CM | POA: Diagnosis not present

## 2016-07-19 DIAGNOSIS — M25562 Pain in left knee: Secondary | ICD-10-CM | POA: Diagnosis not present

## 2016-07-19 DIAGNOSIS — E559 Vitamin D deficiency, unspecified: Secondary | ICD-10-CM | POA: Diagnosis not present

## 2016-07-19 DIAGNOSIS — Z8774 Personal history of (corrected) congenital malformations of heart and circulatory system: Secondary | ICD-10-CM | POA: Diagnosis not present

## 2016-07-19 MED FILL — PROAIR HFA 90 MCG INHALER: 108 (90 BAS | 25 days supply | Qty: 9 | Fill #0

## 2016-07-19 MED FILL — EPINEPHRINE 0.3 MG AUTO-INJ: 0.3 | 2 days supply | Qty: 2 | Fill #0

## 2016-08-29 IMAGING — DX DG KNEE COMPLETE 4+V*L*
4 series · 4 of 4 positions shown · non-contrast
Comparison: None.

CLINICAL DATA: Left knee pain for 2 weeks after hitting metal post.

EXAM:
LEFT KNEE - COMPLETE 4+ VIEW

[knee ap]
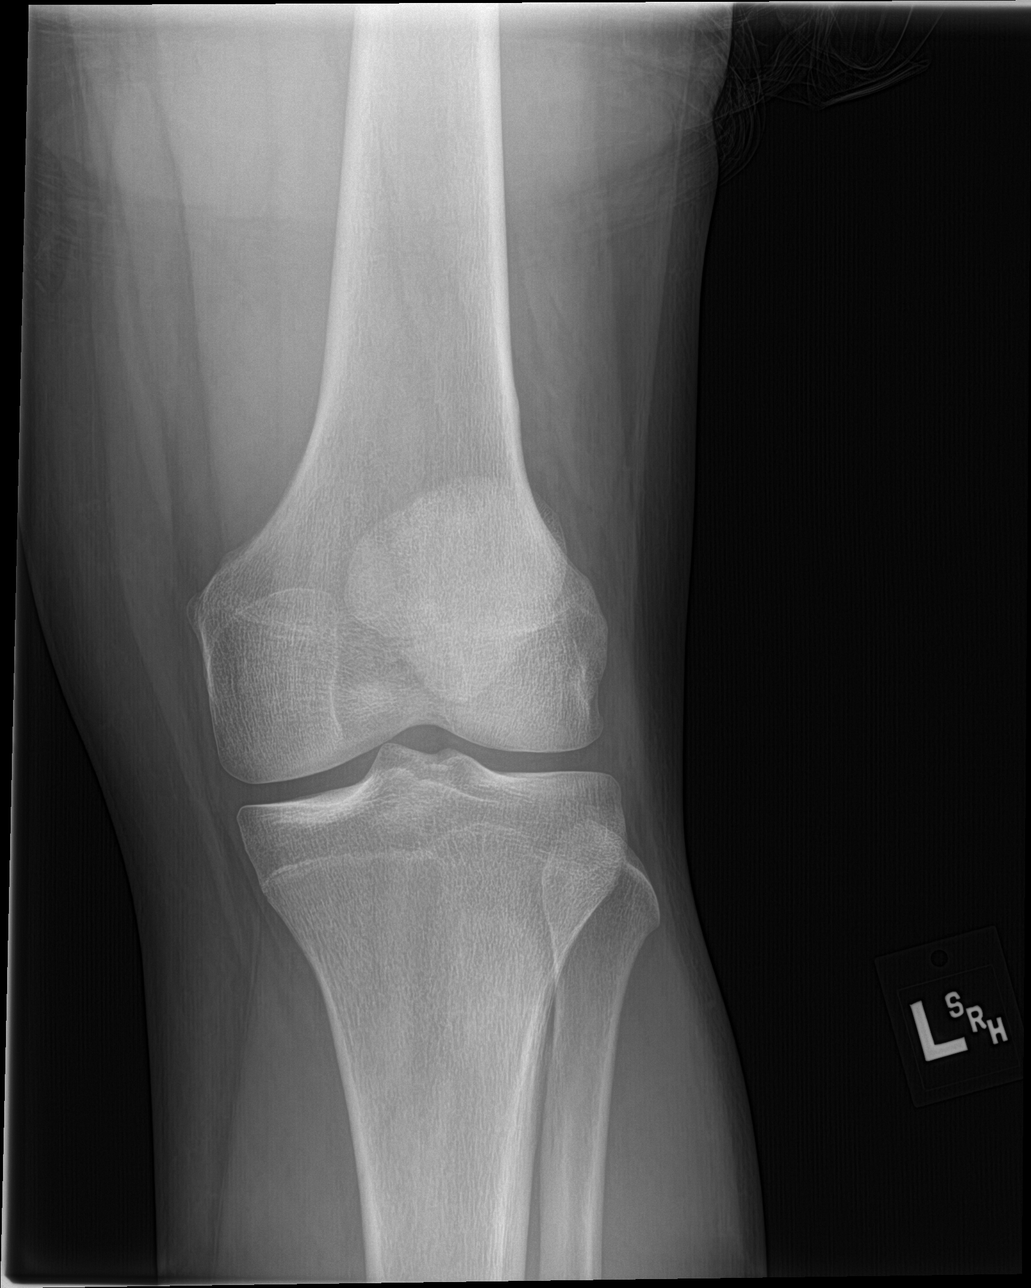

[tunnel]
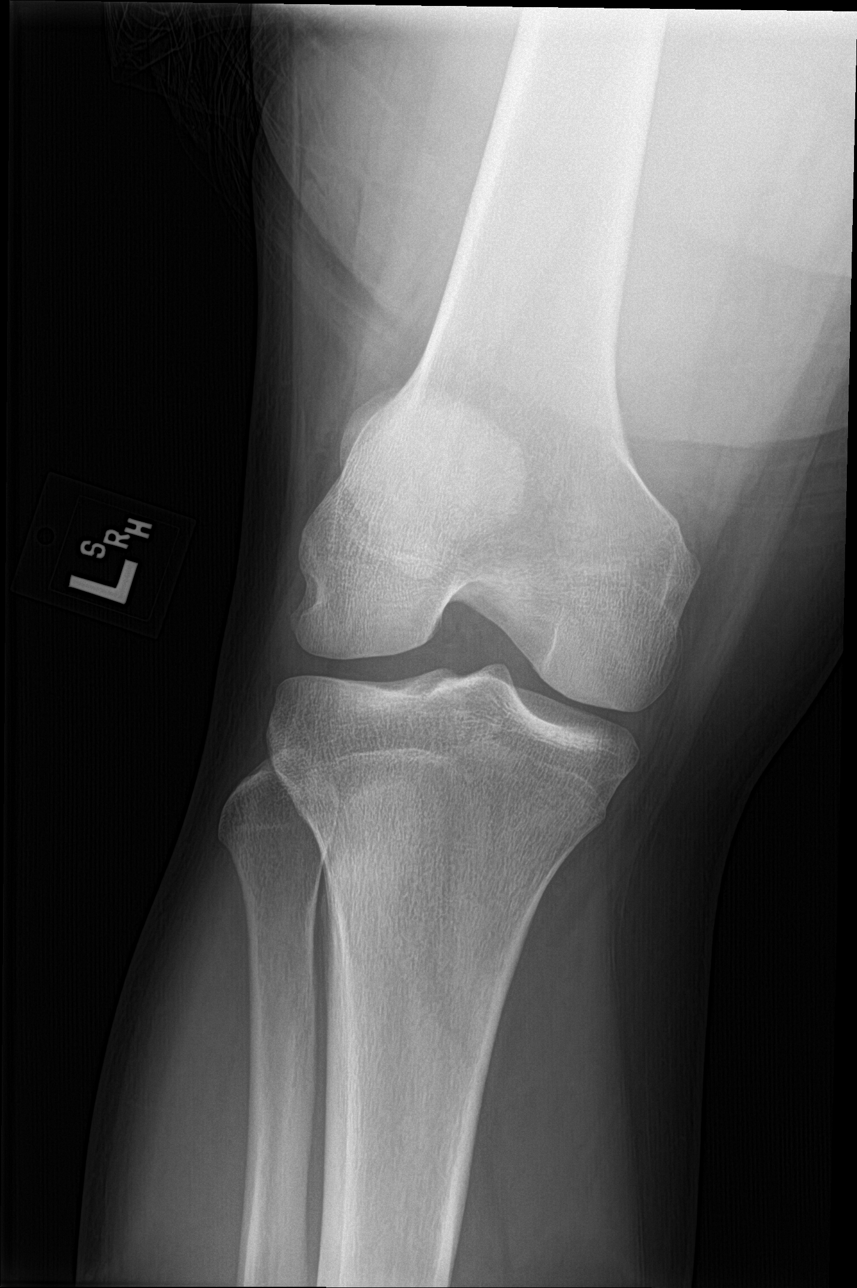

[knee lat]
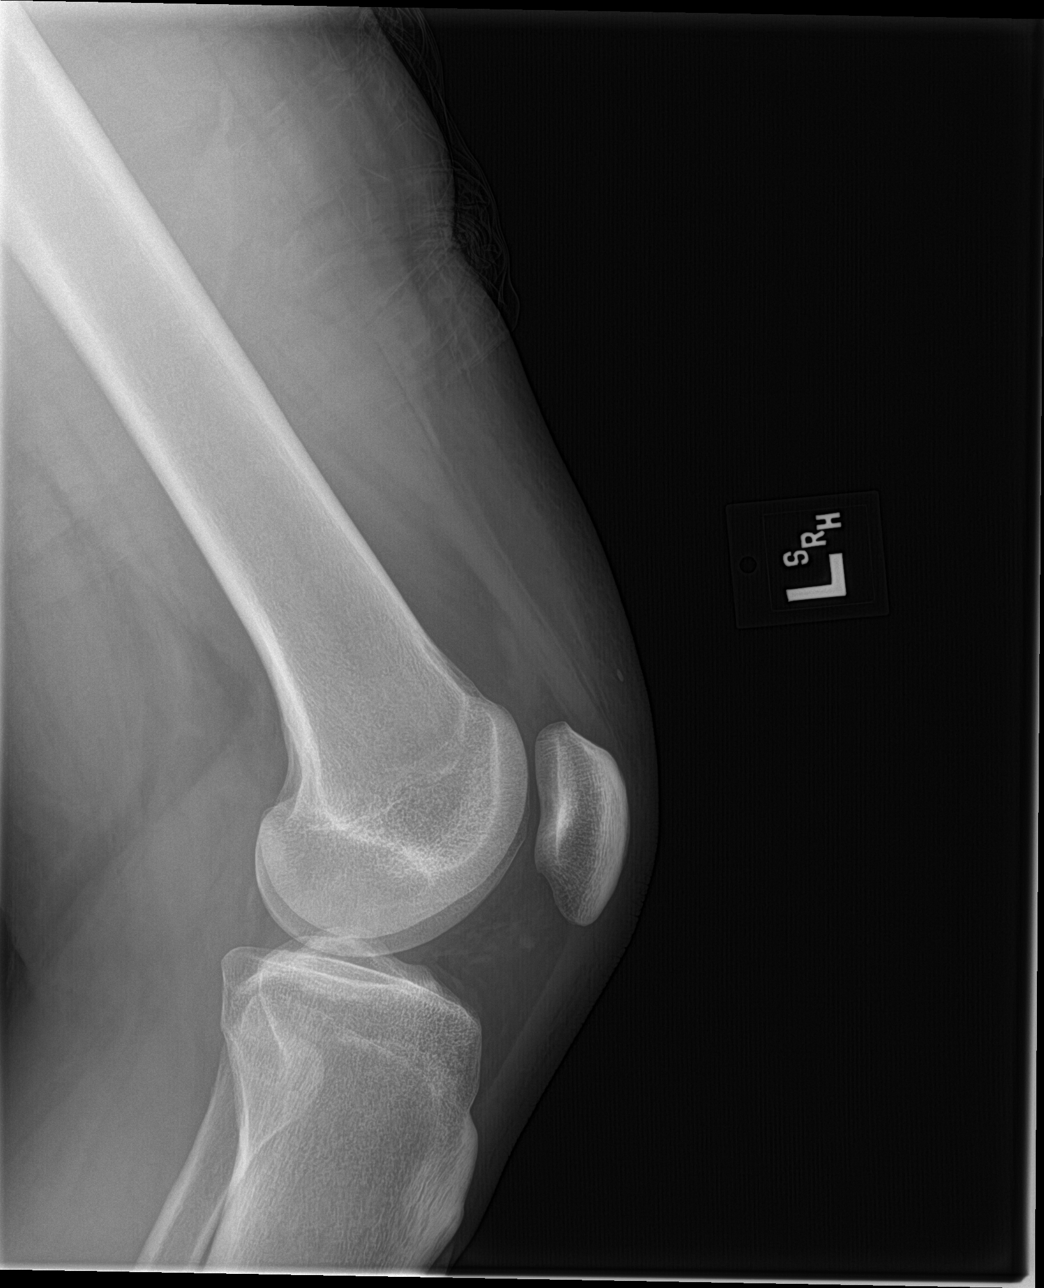

[knee sunrise]
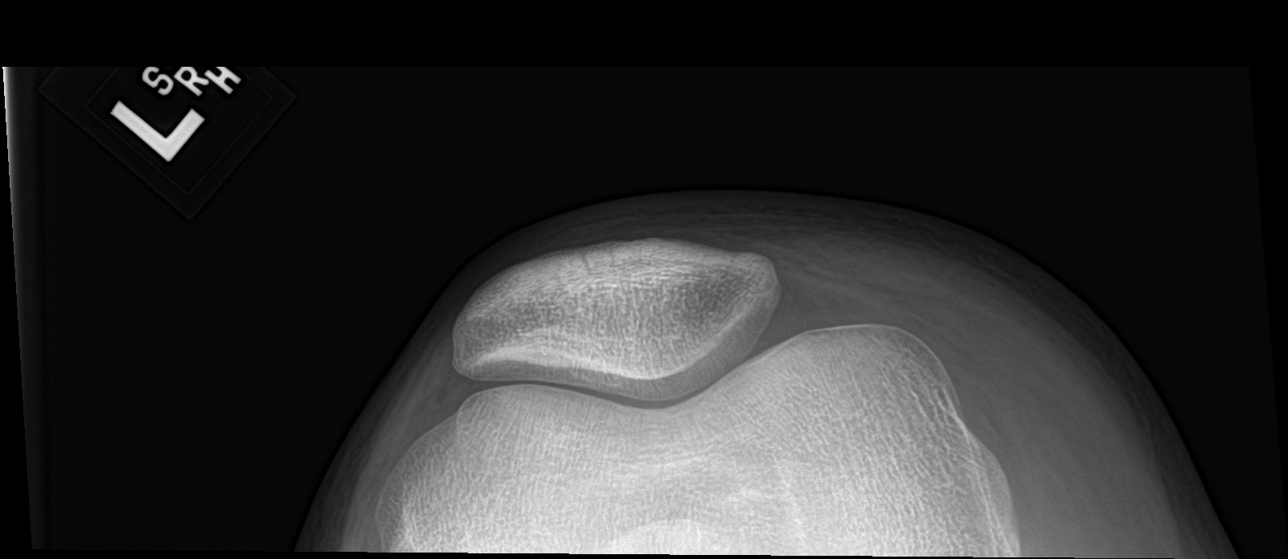

[4 of 4 positions shown; findings below may reference images not displayed]

FINDINGS: There is no evidence of fracture, dislocation, or joint effusion.
There is no evidence of arthropathy or other focal bone abnormality.
Soft tissues are unremarkable.
IMPRESSION: Normal left knee.

## 2019-09-18 ENCOUNTER — Ambulatory Visit: Payer: 59
